# Patient Record
Sex: Female | Born: 1955
Health system: Southern US, Community
[De-identification: ages and names within clinical notes are randomized; demographics above are authoritative.]

## PROBLEM LIST (undated history)

## (undated) DIAGNOSIS — C73 Malignant neoplasm of thyroid gland: Secondary | ICD-10-CM

## (undated) HISTORY — PX: ABDOMINAL HYSTERECTOMY: SHX81

---

## 2006-04-20 HISTORY — PX: BREAST BIOPSY: SHX20

## 2010-04-20 DIAGNOSIS — C73 Malignant neoplasm of thyroid gland: Secondary | ICD-10-CM

## 2010-04-20 HISTORY — DX: Malignant neoplasm of thyroid gland: C73

## 2011-10-26 ENCOUNTER — Ambulatory Visit: Payer: Self-pay | Admitting: Family Medicine

## 2012-02-19 ENCOUNTER — Ambulatory Visit: Payer: Self-pay | Admitting: Gastroenterology

## 2012-10-26 ENCOUNTER — Ambulatory Visit: Payer: Self-pay | Admitting: Family Medicine

## 2013-11-09 ENCOUNTER — Ambulatory Visit: Payer: Self-pay | Admitting: Family Medicine

## 2014-10-17 ENCOUNTER — Other Ambulatory Visit: Payer: Self-pay | Admitting: Family Medicine

## 2014-10-17 DIAGNOSIS — Z1231 Encounter for screening mammogram for malignant neoplasm of breast: Secondary | ICD-10-CM

## 2014-11-12 ENCOUNTER — Ambulatory Visit: Payer: Self-pay

## 2014-11-19 ENCOUNTER — Ambulatory Visit
Admission: RE | Admit: 2014-11-19 | Discharge: 2014-11-19 | Disposition: A | Discharge status: Home / Self Care | Payer: BLUE CROSS/BLUE SHIELD | Source: Ambulatory Visit | Attending: Family Medicine | Admitting: Family Medicine

## 2014-11-19 DIAGNOSIS — Z1231 Encounter for screening mammogram for malignant neoplasm of breast: Secondary | ICD-10-CM | POA: Diagnosis not present

## 2014-11-19 HISTORY — DX: Malignant neoplasm of thyroid gland: C73

## 2015-10-17 DIAGNOSIS — Z7189 Other specified counseling: Secondary | ICD-10-CM | POA: Insufficient documentation

## 2015-10-17 DIAGNOSIS — Z7185 Encounter for immunization safety counseling: Secondary | ICD-10-CM | POA: Insufficient documentation

## 2015-11-18 ENCOUNTER — Other Ambulatory Visit: Payer: Self-pay | Admitting: Family Medicine

## 2015-11-18 DIAGNOSIS — Z1231 Encounter for screening mammogram for malignant neoplasm of breast: Secondary | ICD-10-CM

## 2015-12-02 ENCOUNTER — Ambulatory Visit
Admission: RE | Admit: 2015-12-02 | Discharge: 2015-12-02 | Disposition: A | Discharge status: Home / Self Care | Payer: BLUE CROSS/BLUE SHIELD | Source: Ambulatory Visit | Attending: Family Medicine | Admitting: Family Medicine

## 2015-12-02 ENCOUNTER — Other Ambulatory Visit: Payer: Self-pay | Admitting: Family Medicine

## 2015-12-02 DIAGNOSIS — Z1231 Encounter for screening mammogram for malignant neoplasm of breast: Secondary | ICD-10-CM | POA: Diagnosis present

## 2016-04-17 DIAGNOSIS — E890 Postprocedural hypothyroidism: Secondary | ICD-10-CM | POA: Insufficient documentation

## 2016-11-04 ENCOUNTER — Other Ambulatory Visit: Payer: Self-pay

## 2016-11-04 DIAGNOSIS — Z0000 Encounter for general adult medical examination without abnormal findings: Secondary | ICD-10-CM

## 2016-11-05 LAB — COMPREHENSIVE METABOLIC PANEL
ALT: 21 IU/L (ref 0–32)
AST: 21 IU/L (ref 0–40)
Albumin/Globulin Ratio: 1.9 (ref 1.2–2.2)
Albumin: 4.5 g/dL (ref 3.5–5.5)
Alkaline Phosphatase: 58 IU/L (ref 39–117)
BUN/Creatinine Ratio: 18 (ref 9–23)
BUN: 18 mg/dL (ref 6–24)
Bilirubin Total: 1 mg/dL (ref 0.0–1.2)
CALCIUM: 10 mg/dL (ref 8.7–10.2)
CO2: 25 mmol/L (ref 20–29)
CREATININE: 0.98 mg/dL (ref 0.57–1.00)
Chloride: 100 mmol/L (ref 96–106)
GFR calc Af Amer: 74 mL/min/{1.73_m2} (ref >59)
GFR, EST NON AFRICAN AMERICAN: 64 mL/min/{1.73_m2} (ref >59)
GLOBULIN, TOTAL: 2.4 g/dL (ref 1.5–4.5)
GLUCOSE: 90 mg/dL (ref 65–99)
Potassium: 4.9 mmol/L (ref 3.5–5.2)
SODIUM: 140 mmol/L (ref 134–144)
Total Protein: 6.9 g/dL (ref 6.0–8.5)

## 2016-11-05 LAB — CBC WITH DIFFERENTIAL/PLATELET
BASOS: 0 %
Basophils Absolute: 0 10*3/uL (ref 0.0–0.2)
EOS (ABSOLUTE): 0.1 10*3/uL (ref 0.0–0.4)
EOS: 3 %
HEMATOCRIT: 41.8 % (ref 34.0–46.6)
HEMOGLOBIN: 13.5 g/dL (ref 11.1–15.9)
IMMATURE GRANS (ABS): 0 10*3/uL (ref 0.0–0.1)
IMMATURE GRANULOCYTES: 0 %
LYMPHS: 30 %
Lymphocytes Absolute: 1.3 10*3/uL (ref 0.7–3.1)
MCH: 26.7 pg (ref 26.6–33.0)
MCHC: 32.3 g/dL (ref 31.5–35.7)
MCV: 83 fL (ref 79–97)
MONOCYTES: 7 %
MONOS ABS: 0.3 10*3/uL (ref 0.1–0.9)
Neutrophils Absolute: 2.7 10*3/uL (ref 1.4–7.0)
Neutrophils: 60 %
Platelets: 385 10*3/uL — ABNORMAL HIGH (ref 150–379)
RBC: 5.06 x10E6/uL (ref 3.77–5.28)
RDW: 14.6 % (ref 12.3–15.4)
WBC: 4.5 10*3/uL (ref 3.4–10.8)

## 2016-11-05 LAB — HGB A1C W/O EAG: Hgb A1c MFr Bld: 5.6 % (ref 4.8–5.6)

## 2016-11-12 LAB — LIPID PANEL W/O CHOL/HDL RATIO
CHOLESTEROL TOTAL: 211 mg/dL — AB (ref 100–199)
HDL: 62 mg/dL (ref >39)
LDL CALC: 127 mg/dL — AB (ref 0–99)
Triglycerides: 112 mg/dL (ref 0–149)
VLDL Cholesterol Cal: 22 mg/dL (ref 5–40)

## 2016-11-12 LAB — SPECIMEN STATUS REPORT

## 2016-11-24 DIAGNOSIS — G4733 Obstructive sleep apnea (adult) (pediatric): Secondary | ICD-10-CM | POA: Diagnosis not present

## 2016-12-25 DIAGNOSIS — G4733 Obstructive sleep apnea (adult) (pediatric): Secondary | ICD-10-CM | POA: Diagnosis not present

## 2017-01-29 ENCOUNTER — Other Ambulatory Visit: Payer: Self-pay | Admitting: Family Medicine

## 2017-01-29 DIAGNOSIS — Z1231 Encounter for screening mammogram for malignant neoplasm of breast: Secondary | ICD-10-CM

## 2017-02-17 DIAGNOSIS — E890 Postprocedural hypothyroidism: Secondary | ICD-10-CM | POA: Diagnosis not present

## 2017-02-17 DIAGNOSIS — Z85850 Personal history of malignant neoplasm of thyroid: Secondary | ICD-10-CM | POA: Diagnosis not present

## 2017-02-24 ENCOUNTER — Ambulatory Visit
Admission: RE | Admit: 2017-02-24 | Discharge: 2017-02-24 | Disposition: A | Discharge status: Home / Self Care | Payer: BLUE CROSS/BLUE SHIELD | Source: Ambulatory Visit | Attending: Family Medicine | Admitting: Family Medicine

## 2017-02-24 DIAGNOSIS — Z1231 Encounter for screening mammogram for malignant neoplasm of breast: Secondary | ICD-10-CM | POA: Insufficient documentation

## 2017-02-24 DIAGNOSIS — D485 Neoplasm of uncertain behavior of skin: Secondary | ICD-10-CM | POA: Diagnosis not present

## 2017-02-24 DIAGNOSIS — Z85850 Personal history of malignant neoplasm of thyroid: Secondary | ICD-10-CM | POA: Diagnosis not present

## 2017-02-24 DIAGNOSIS — L219 Seborrheic dermatitis, unspecified: Secondary | ICD-10-CM | POA: Diagnosis not present

## 2017-03-05 DIAGNOSIS — E890 Postprocedural hypothyroidism: Secondary | ICD-10-CM | POA: Diagnosis not present

## 2017-03-05 DIAGNOSIS — Z85850 Personal history of malignant neoplasm of thyroid: Secondary | ICD-10-CM | POA: Diagnosis not present

## 2017-04-22 DIAGNOSIS — D2210 Melanocytic nevi of unspecified eyelid, including canthus: Secondary | ICD-10-CM | POA: Diagnosis not present

## 2017-04-29 DIAGNOSIS — L219 Seborrheic dermatitis, unspecified: Secondary | ICD-10-CM | POA: Diagnosis not present

## 2017-04-29 DIAGNOSIS — D485 Neoplasm of uncertain behavior of skin: Secondary | ICD-10-CM | POA: Diagnosis not present

## 2017-08-19 DIAGNOSIS — G4733 Obstructive sleep apnea (adult) (pediatric): Secondary | ICD-10-CM | POA: Diagnosis not present

## 2017-11-04 DIAGNOSIS — D485 Neoplasm of uncertain behavior of skin: Secondary | ICD-10-CM | POA: Diagnosis not present

## 2017-11-04 DIAGNOSIS — Z1283 Encounter for screening for malignant neoplasm of skin: Secondary | ICD-10-CM | POA: Diagnosis not present

## 2017-11-04 DIAGNOSIS — D225 Melanocytic nevi of trunk: Secondary | ICD-10-CM | POA: Diagnosis not present

## 2017-11-04 DIAGNOSIS — L219 Seborrheic dermatitis, unspecified: Secondary | ICD-10-CM | POA: Diagnosis not present

## 2017-11-04 DIAGNOSIS — L820 Inflamed seborrheic keratosis: Secondary | ICD-10-CM | POA: Diagnosis not present

## 2017-11-19 DIAGNOSIS — Z0000 Encounter for general adult medical examination without abnormal findings: Secondary | ICD-10-CM | POA: Diagnosis not present

## 2017-11-19 DIAGNOSIS — Z131 Encounter for screening for diabetes mellitus: Secondary | ICD-10-CM | POA: Diagnosis not present

## 2017-11-26 DIAGNOSIS — I10 Essential (primary) hypertension: Secondary | ICD-10-CM | POA: Diagnosis not present

## 2017-11-26 DIAGNOSIS — R7303 Prediabetes: Secondary | ICD-10-CM | POA: Diagnosis not present

## 2017-11-26 DIAGNOSIS — Z0000 Encounter for general adult medical examination without abnormal findings: Secondary | ICD-10-CM | POA: Diagnosis not present

## 2017-11-26 DIAGNOSIS — E785 Hyperlipidemia, unspecified: Secondary | ICD-10-CM | POA: Diagnosis not present

## 2018-01-07 ENCOUNTER — Encounter: Payer: Self-pay | Admitting: Adult Health

## 2018-01-07 ENCOUNTER — Ambulatory Visit: Payer: Self-pay | Admitting: Adult Health

## 2018-01-07 VITALS — BP 140/75 | HR 72 | Temp 98.1°F | Resp 16 | Ht 65.0 in | Wt 159.0 lb

## 2018-01-07 DIAGNOSIS — G4733 Obstructive sleep apnea (adult) (pediatric): Secondary | ICD-10-CM | POA: Insufficient documentation

## 2018-01-07 DIAGNOSIS — Z9989 Dependence on other enabling machines and devices: Secondary | ICD-10-CM

## 2018-01-07 DIAGNOSIS — H66001 Acute suppurative otitis media without spontaneous rupture of ear drum, right ear: Secondary | ICD-10-CM

## 2018-01-07 DIAGNOSIS — I10 Essential (primary) hypertension: Secondary | ICD-10-CM | POA: Insufficient documentation

## 2018-01-07 MED ORDER — AMOXICILLIN 875 MG PO TABS: 875.0000 mg | ORAL_TABLET | Freq: Two times a day (BID) | ORAL | 0 refills | Status: AC

## 2018-01-07 NOTE — Progress Notes (Signed)
Subjective:     Patient ID: Sherri Howe, female   DOB: 08-13-1955, 62 y.o.   MRN: [id number]  HPI   Blood pressure 140/75, pulse 72, temperature 98.1 F (36.7 C), resp. rate 16, height 5\' 5"  (1.651 m), weight 159 lb (72.1 kg), SpO2 100 %.  No LMP recorded. Patient has had a hysterectomy.  Patient is a 62 year old female in no acute distress right 9/19right ear pain since yesterday.  Mild allergies- stuffy nose, mild scratchy dry throat. Not taking Claritin. Denies any other symptoms.   Patient  denies any fever, body aches,chills, rash, chest pain, shortness of breath, nausea, vomiting, or diarrhea.    Review of Systems  Constitutional: Negative.   HENT: Positive for ear pain and rhinorrhea. Negative for congestion, dental problem, drooling, ear discharge, facial swelling, hearing loss, mouth sores, nosebleeds, postnasal drip, sinus pressure, sinus pain, sneezing, sore throat, tinnitus, trouble swallowing and voice change.   Respiratory: Negative.   Cardiovascular: Negative.   Gastrointestinal: Negative.   Genitourinary: Negative.   Musculoskeletal: Negative.   Allergic/Immunologic: Negative.   Neurological: Negative.   Hematological: Negative.   Psychiatric/Behavioral: Negative.        Objective:   Physical Exam  Constitutional: Vital signs are normal. She appears well-developed and well-nourished. She is active.  Non-toxic appearance. No distress.  HENT:  Head: Normocephalic and atraumatic.  Right Ear: Hearing, external ear and ear canal normal. No tenderness. Tympanic membrane is not perforated and not erythematous. A middle ear effusion (fluid is sligtly yellow behind lower normal in apperance tympanic membrane ) is present.  Left Ear: Hearing and external ear normal. No tenderness. Tympanic membrane is not perforated and not erythematous. A middle ear effusion (clear fluid behind tympanic membrane ) is present.  Nose: Rhinorrhea present. No mucosal edema, nose  lacerations, sinus tenderness, nasal deformity, septal deviation or nasal septal hematoma. No epistaxis.  No foreign bodies. Right sinus exhibits no maxillary sinus tenderness and no frontal sinus tenderness. Left sinus exhibits no maxillary sinus tenderness and no frontal sinus tenderness.  Mouth/Throat: Uvula is midline, oropharynx is clear and moist and mucous membranes are normal. No oropharyngeal exudate. Tonsils are 0 on the right. Tonsils are 0 on the left. No tonsillar exudate.  Eyes: Pupils are equal, round, and reactive to light. Conjunctivae and EOM are normal. Right eye exhibits no discharge. Left eye exhibits no discharge. No scleral icterus.  Neck: Normal range of motion. Neck supple. No JVD present. No tracheal deviation present. No thyromegaly present.  Cardiovascular: Normal rate, regular rhythm, normal heart sounds and intact distal pulses. Exam reveals no gallop and no friction rub.  No murmur heard. Pulmonary/Chest: Effort normal and breath sounds normal. No stridor. No respiratory distress. She has no wheezes. She has no rales. She exhibits no tenderness.  Abdominal: Soft. Bowel sounds are normal.  Musculoskeletal: Normal range of motion.  Lymphadenopathy:    She has no cervical adenopathy.  Neurological: She is alert.  Skin: Skin is warm and dry. Capillary refill takes less than 2 seconds. She is not diaphoretic.  Psychiatric: She has a normal mood and affect.  Vitals reviewed.      Assessment:     Non-recurrent acute suppurative otitis media of right ear without spontaneous rupture of tympanic membrane      Plan:     Only antibiotics  take if ear pain worsens over the weekend, or if fever 100.5 or higher.  Advised start Claritin and Flonase daily per package instructions  and continue. Return to the clinic if no improvement within 1 week and ant any time if symptoms worsen.     Meds ordered this encounter  Medications  . amoxicillin (AMOXIL) 875 MG tablet     Sig: Take 1 tablet (875 mg total) by mouth 2 (two) times daily.    Dispense:  20 tablet    Refill:  0     Advised patient call the office or your primary care doctor for an appointment if no improvement within 72 hours or if any symptoms change or worsen at any time  Advised ER or urgent Care if after hours or on weekend. Call 911 for emergency symptoms at any time.Patinet verbalized understanding of all instructions given/reviewed and treatment plan and has no further questions or concerns at this time.    Patient verbalized understanding of all instructions given and denies any further questions at this time.

## 2018-01-07 NOTE — Patient Instructions (Signed)
Otitis Media, Adult Otitis media is redness, soreness, and puffiness (swelling) in the space just behind your eardrum (middle ear). It may be caused by allergies or infection. It often happens along with a cold. Follow these instructions at home:  Take your medicine as told. Finish it even if you start to feel better.  Only take over-the-counter or prescription medicines for pain, discomfort, or fever as told by your doctor.  Follow up with your doctor as told. Contact a doctor if:  You have otitis media only in one ear, or bleeding from your nose, or both.  You notice a lump on your neck.  You are not getting better in 3-5 days.  You feel worse instead of better. Get help right away if:  You have pain that is not helped with medicine.  You have puffiness, redness, or pain around your ear.  You get a stiff neck.  You cannot move part of your face (paralysis).  You notice that the bone behind your ear hurts when you touch it. This information is not intended to replace advice given to you by your health care provider. Make sure you discuss any questions you have with your health care provider. Document Released: 09/23/2007 Document Revised: 09/12/2015 Document Reviewed: 11/01/2012 [employer] Interactive Patient Education  2017 [employer]  

## 2018-02-25 DIAGNOSIS — E890 Postprocedural hypothyroidism: Secondary | ICD-10-CM | POA: Diagnosis not present

## 2018-02-28 ENCOUNTER — Other Ambulatory Visit: Payer: Self-pay | Admitting: Family Medicine

## 2018-02-28 DIAGNOSIS — Z1231 Encounter for screening mammogram for malignant neoplasm of breast: Secondary | ICD-10-CM

## 2018-03-04 DIAGNOSIS — E890 Postprocedural hypothyroidism: Secondary | ICD-10-CM | POA: Diagnosis not present

## 2018-03-25 ENCOUNTER — Ambulatory Visit
Admission: RE | Admit: 2018-03-25 | Discharge: 2018-03-25 | Disposition: A | Discharge status: Home / Self Care | Payer: BLUE CROSS/BLUE SHIELD | Source: Ambulatory Visit | Attending: Family Medicine | Admitting: Family Medicine

## 2018-03-25 DIAGNOSIS — Z1231 Encounter for screening mammogram for malignant neoplasm of breast: Secondary | ICD-10-CM | POA: Insufficient documentation

## 2018-05-03 DIAGNOSIS — E785 Hyperlipidemia, unspecified: Secondary | ICD-10-CM | POA: Diagnosis not present

## 2018-05-03 DIAGNOSIS — R7303 Prediabetes: Secondary | ICD-10-CM | POA: Diagnosis not present

## 2018-05-03 DIAGNOSIS — I10 Essential (primary) hypertension: Secondary | ICD-10-CM | POA: Diagnosis not present

## 2018-05-10 DIAGNOSIS — I10 Essential (primary) hypertension: Secondary | ICD-10-CM | POA: Diagnosis not present

## 2018-05-10 DIAGNOSIS — R7303 Prediabetes: Secondary | ICD-10-CM | POA: Diagnosis not present

## 2018-05-10 DIAGNOSIS — G4733 Obstructive sleep apnea (adult) (pediatric): Secondary | ICD-10-CM | POA: Diagnosis not present

## 2018-05-10 DIAGNOSIS — E785 Hyperlipidemia, unspecified: Secondary | ICD-10-CM | POA: Diagnosis not present

## 2018-11-01 DIAGNOSIS — E039 Hypothyroidism, unspecified: Secondary | ICD-10-CM | POA: Diagnosis not present

## 2018-11-01 DIAGNOSIS — E785 Hyperlipidemia, unspecified: Secondary | ICD-10-CM | POA: Diagnosis not present

## 2018-11-01 DIAGNOSIS — Z0000 Encounter for general adult medical examination without abnormal findings: Secondary | ICD-10-CM | POA: Diagnosis not present

## 2018-11-01 DIAGNOSIS — E559 Vitamin D deficiency, unspecified: Secondary | ICD-10-CM | POA: Diagnosis not present

## 2018-11-01 DIAGNOSIS — I10 Essential (primary) hypertension: Secondary | ICD-10-CM | POA: Diagnosis not present

## 2018-12-01 DIAGNOSIS — H40023 Open angle with borderline findings, high risk, bilateral: Secondary | ICD-10-CM | POA: Diagnosis not present

## 2018-12-01 DIAGNOSIS — Z83511 Family history of glaucoma: Secondary | ICD-10-CM | POA: Diagnosis not present

## 2019-05-30 DIAGNOSIS — R410 Disorientation, unspecified: Secondary | ICD-10-CM

## 2019-05-30 DIAGNOSIS — I160 Hypertensive urgency: Secondary | ICD-10-CM

## 2019-05-30 DIAGNOSIS — E785 Hyperlipidemia, unspecified: Secondary | ICD-10-CM

## 2019-05-31 DIAGNOSIS — R410 Disorientation, unspecified: Secondary | ICD-10-CM | POA: Diagnosis not present

## 2019-05-31 DIAGNOSIS — E785 Hyperlipidemia, unspecified: Secondary | ICD-10-CM | POA: Diagnosis not present

## 2019-05-31 DIAGNOSIS — I160 Hypertensive urgency: Secondary | ICD-10-CM | POA: Diagnosis not present

## 2020-05-26 IMAGING — MG DIGITAL SCREENING BILATERAL MAMMOGRAM WITH TOMO AND CAD
8 series · 9 of 24 positions shown · non-contrast
Comparison: Previous exam(s).

CLINICAL DATA: Screening.

EXAM:
DIGITAL SCREENING BILATERAL MAMMOGRAM WITH TOMO AND CAD

[R MLO synth-2D]
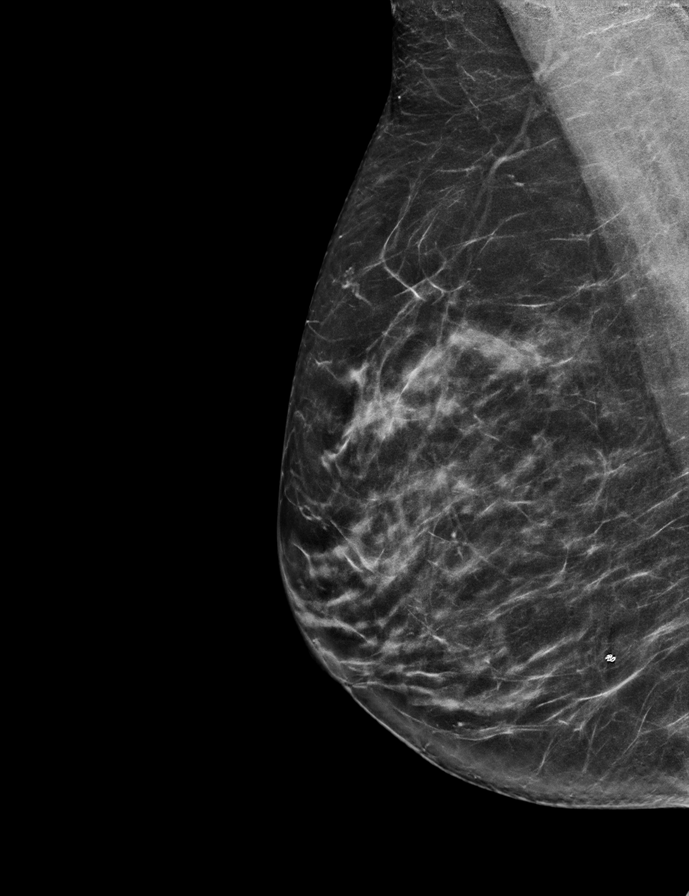

[L MLO synth-2D]
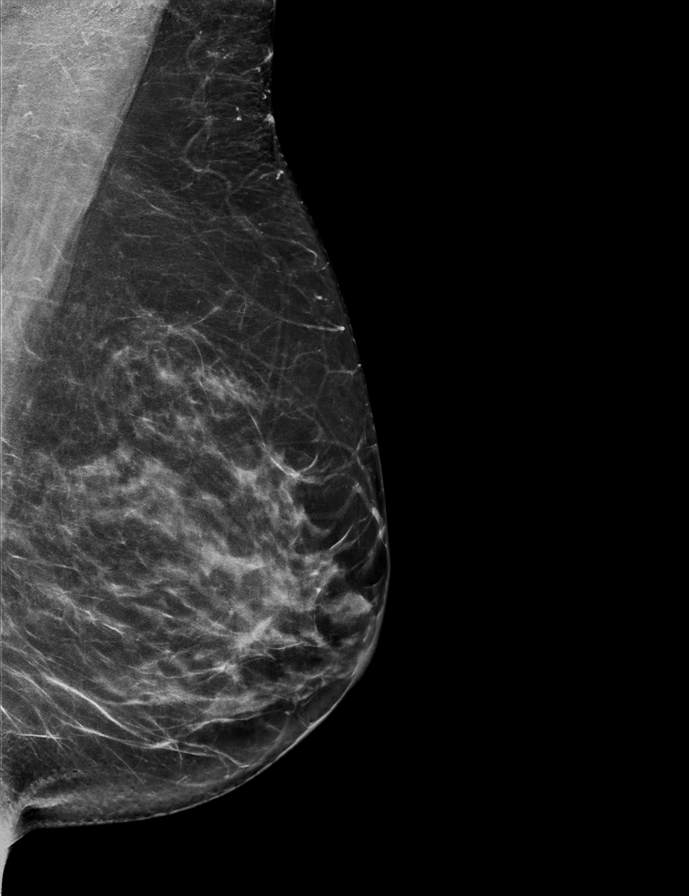

[R CC synth-2D]
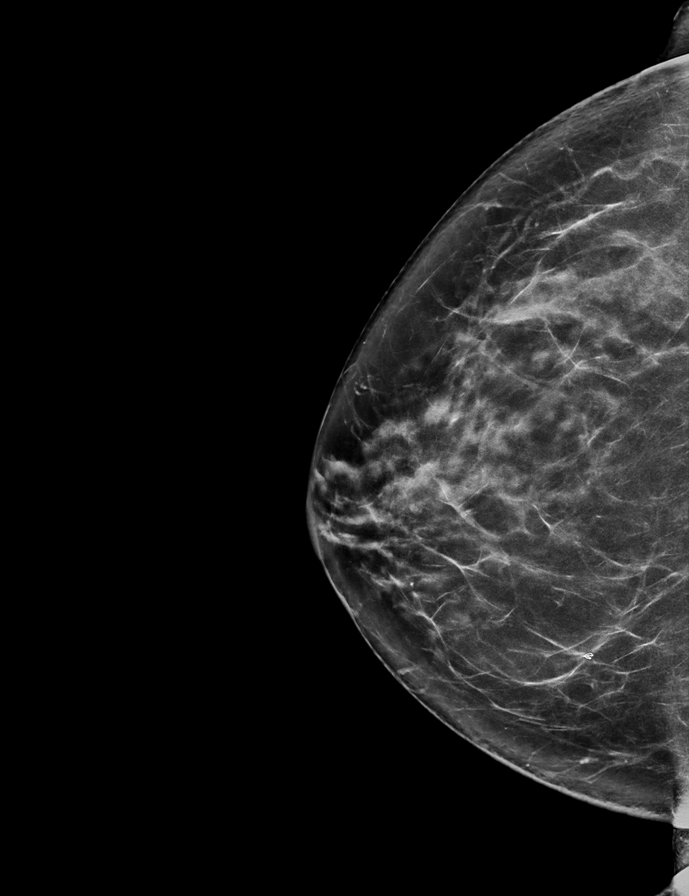

[L CC synth-2D]
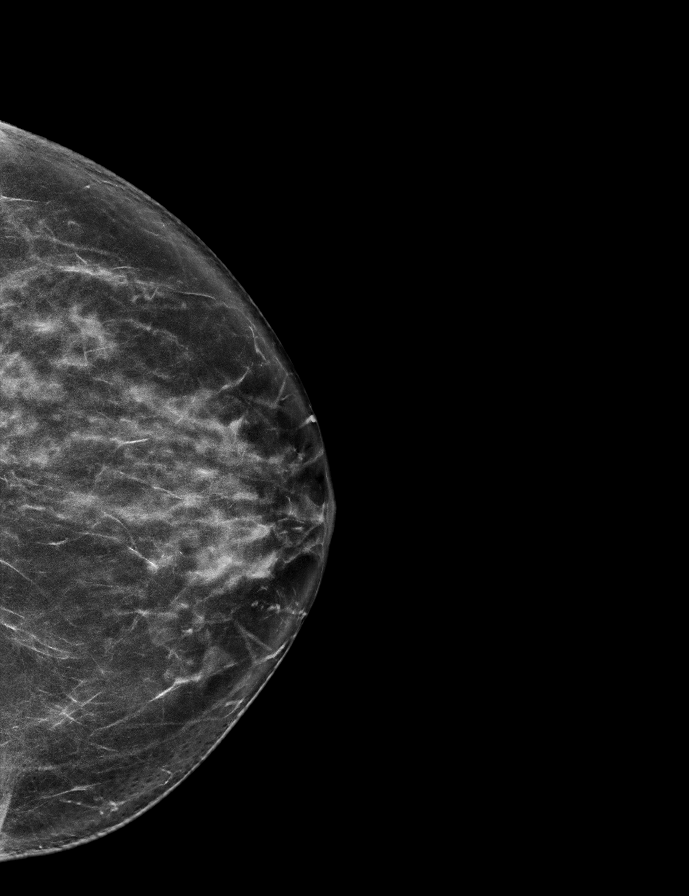

[L CC tomo · 2 of 71 frames shown]
[frame 23/71]
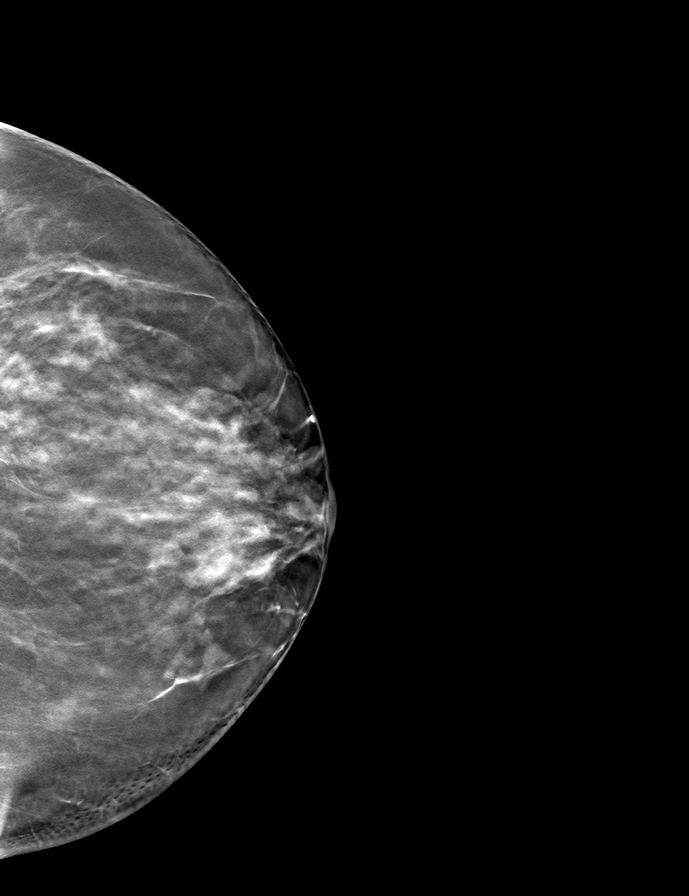
[frame 36/71]
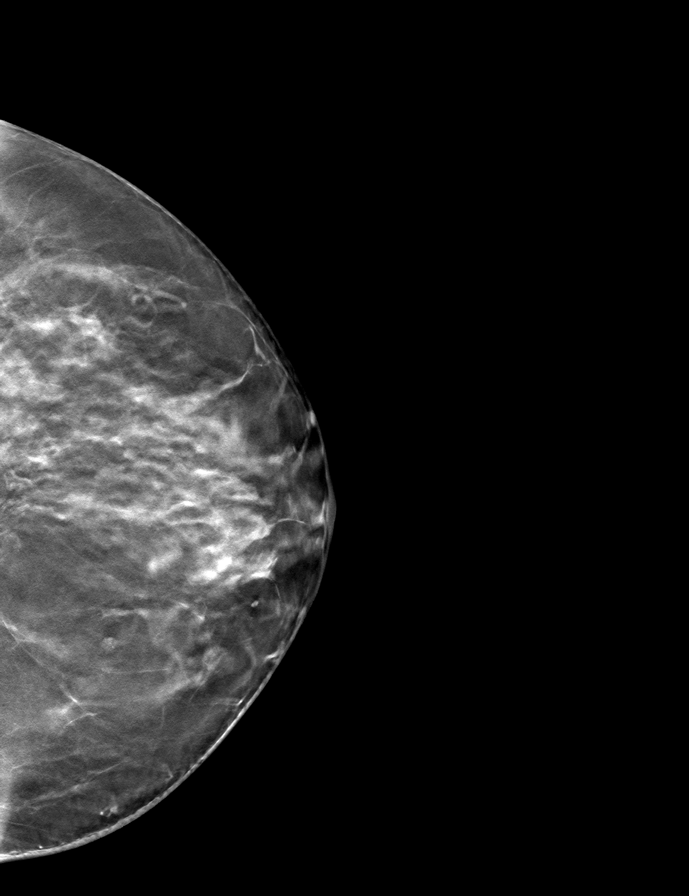

[R CC tomo · tomo slice 37/74.0]
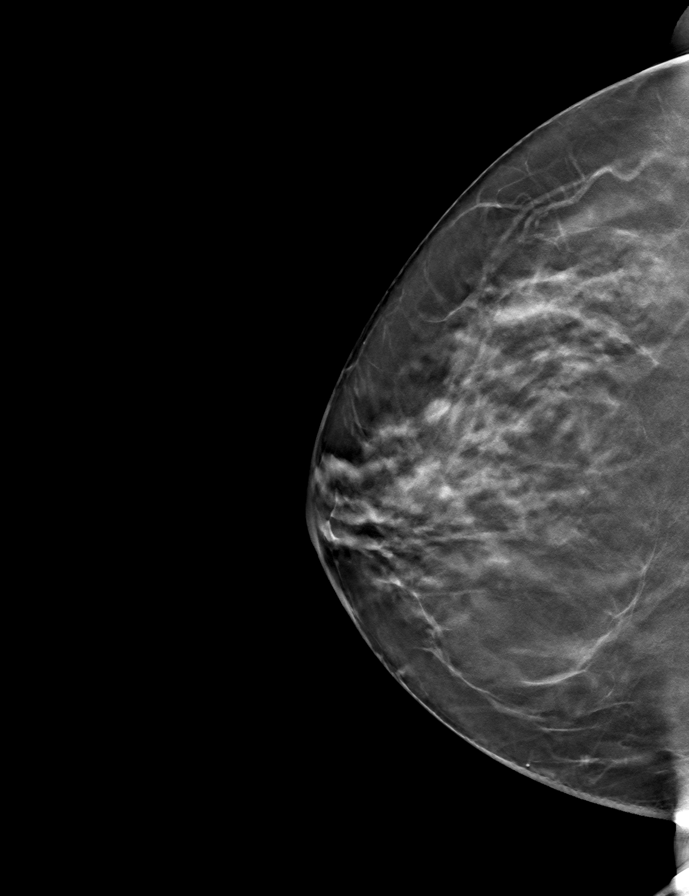

[L MLO tomo · tomo slice 37/72.0]
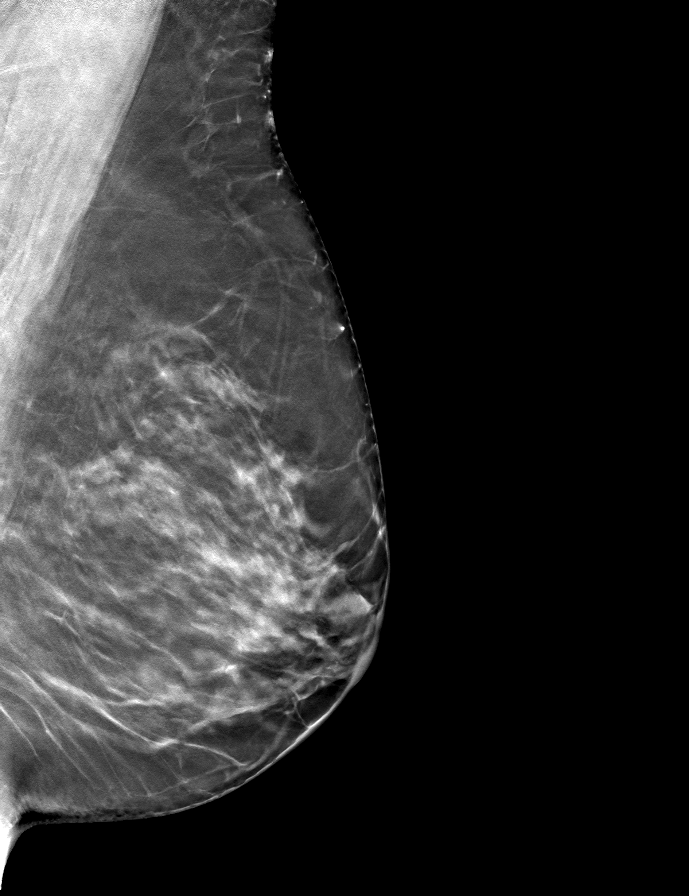

[R MLO tomo · tomo slice 35/68.0]
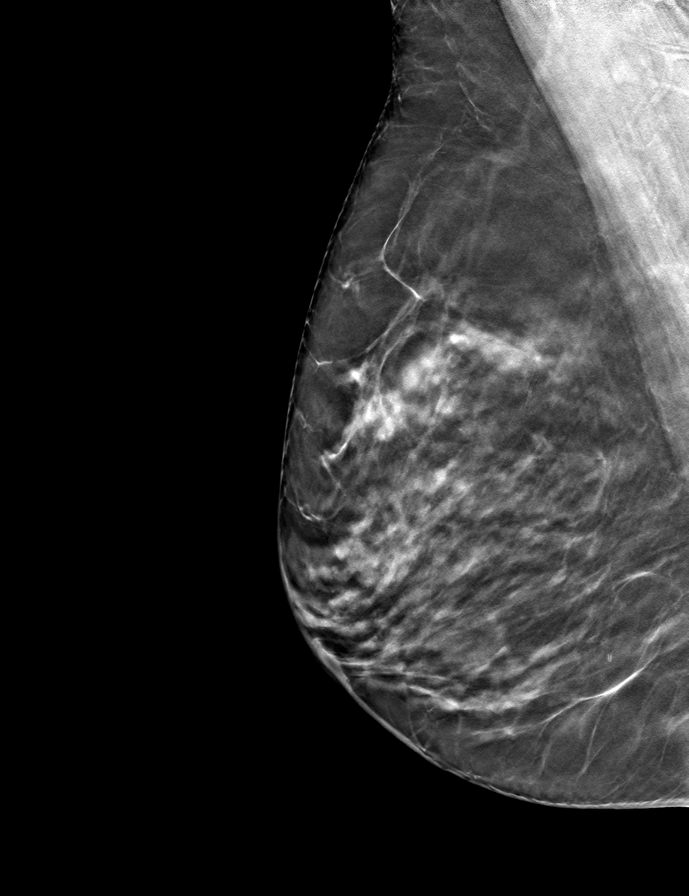

[9 of 24 positions shown; findings below may reference images not displayed]

ACR Breast Density Category b: There are scattered areas of
fibroglandular density.
FINDINGS: There are no findings suspicious for malignancy. Images were
processed with CAD.
IMPRESSION: No mammographic evidence of malignancy. A result letter of this
screening mammogram will be mailed directly to the patient.

RECOMMENDATION:
Screening mammogram in one year. (Code:[id number])

BI-RADS CATEGORY  1: Negative.

## 2021-05-03 DIAGNOSIS — E785 Hyperlipidemia, unspecified: Secondary | ICD-10-CM | POA: Diagnosis not present

## 2021-05-03 DIAGNOSIS — R7989 Other specified abnormal findings of blood chemistry: Secondary | ICD-10-CM | POA: Diagnosis not present

## 2021-05-03 DIAGNOSIS — E039 Hypothyroidism, unspecified: Secondary | ICD-10-CM | POA: Diagnosis not present

## 2021-05-03 DIAGNOSIS — I10 Essential (primary) hypertension: Secondary | ICD-10-CM | POA: Diagnosis not present

## 2021-08-02 DIAGNOSIS — Z6827 Body mass index (BMI) 27.0-27.9, adult: Secondary | ICD-10-CM | POA: Diagnosis not present

## 2021-08-02 DIAGNOSIS — Z0000 Encounter for general adult medical examination without abnormal findings: Secondary | ICD-10-CM | POA: Diagnosis not present

## 2021-08-02 DIAGNOSIS — E039 Hypothyroidism, unspecified: Secondary | ICD-10-CM | POA: Diagnosis not present

## 2021-08-02 DIAGNOSIS — E785 Hyperlipidemia, unspecified: Secondary | ICD-10-CM | POA: Diagnosis not present

## 2021-08-02 DIAGNOSIS — I10 Essential (primary) hypertension: Secondary | ICD-10-CM | POA: Diagnosis not present

## 2021-10-01 DIAGNOSIS — Z79899 Other long term (current) drug therapy: Secondary | ICD-10-CM | POA: Diagnosis not present

## 2021-10-01 DIAGNOSIS — G40209 Localization-related (focal) (partial) symptomatic epilepsy and epileptic syndromes with complex partial seizures, not intractable, without status epilepticus: Secondary | ICD-10-CM | POA: Diagnosis not present

## 2021-11-24 DIAGNOSIS — I10 Essential (primary) hypertension: Secondary | ICD-10-CM | POA: Diagnosis not present

## 2021-11-24 DIAGNOSIS — E039 Hypothyroidism, unspecified: Secondary | ICD-10-CM | POA: Diagnosis not present

## 2021-11-24 DIAGNOSIS — Z79899 Other long term (current) drug therapy: Secondary | ICD-10-CM | POA: Diagnosis not present

## 2021-11-24 DIAGNOSIS — Z23 Encounter for immunization: Secondary | ICD-10-CM | POA: Diagnosis not present

## 2021-11-24 DIAGNOSIS — E785 Hyperlipidemia, unspecified: Secondary | ICD-10-CM | POA: Diagnosis not present

## 2021-11-24 DIAGNOSIS — E559 Vitamin D deficiency, unspecified: Secondary | ICD-10-CM | POA: Diagnosis not present

## 2021-12-01 DIAGNOSIS — Z1231 Encounter for screening mammogram for malignant neoplasm of breast: Secondary | ICD-10-CM | POA: Diagnosis not present

## 2021-12-02 DIAGNOSIS — H40023 Open angle with borderline findings, high risk, bilateral: Secondary | ICD-10-CM | POA: Diagnosis not present

## 2021-12-02 DIAGNOSIS — Z83511 Family history of glaucoma: Secondary | ICD-10-CM | POA: Diagnosis not present

## 2021-12-24 DIAGNOSIS — R928 Other abnormal and inconclusive findings on diagnostic imaging of breast: Secondary | ICD-10-CM | POA: Diagnosis not present

## 2021-12-24 DIAGNOSIS — R922 Inconclusive mammogram: Secondary | ICD-10-CM | POA: Diagnosis not present

## 2022-02-19 DIAGNOSIS — Z01818 Encounter for other preprocedural examination: Secondary | ICD-10-CM | POA: Diagnosis not present

## 2022-03-24 DIAGNOSIS — Z1211 Encounter for screening for malignant neoplasm of colon: Secondary | ICD-10-CM | POA: Diagnosis not present

## 2022-03-24 DIAGNOSIS — Z7982 Long term (current) use of aspirin: Secondary | ICD-10-CM | POA: Diagnosis not present

## 2022-06-02 DIAGNOSIS — E785 Hyperlipidemia, unspecified: Secondary | ICD-10-CM | POA: Diagnosis not present

## 2022-06-02 DIAGNOSIS — I10 Essential (primary) hypertension: Secondary | ICD-10-CM | POA: Diagnosis not present

## 2022-06-02 DIAGNOSIS — Z79899 Other long term (current) drug therapy: Secondary | ICD-10-CM | POA: Diagnosis not present

## 2022-06-02 DIAGNOSIS — E559 Vitamin D deficiency, unspecified: Secondary | ICD-10-CM | POA: Diagnosis not present

## 2022-06-02 DIAGNOSIS — E039 Hypothyroidism, unspecified: Secondary | ICD-10-CM | POA: Diagnosis not present

## 2022-06-02 DIAGNOSIS — R7303 Prediabetes: Secondary | ICD-10-CM | POA: Diagnosis not present

## 2022-10-02 DIAGNOSIS — G40209 Localization-related (focal) (partial) symptomatic epilepsy and epileptic syndromes with complex partial seizures, not intractable, without status epilepticus: Secondary | ICD-10-CM | POA: Diagnosis not present

## 2022-12-01 ENCOUNTER — Other Ambulatory Visit: Payer: Self-pay | Admitting: Family Medicine

## 2022-12-01 DIAGNOSIS — I10 Essential (primary) hypertension: Secondary | ICD-10-CM | POA: Diagnosis not present

## 2022-12-01 DIAGNOSIS — Z83511 Family history of glaucoma: Secondary | ICD-10-CM | POA: Diagnosis not present

## 2022-12-01 DIAGNOSIS — R7303 Prediabetes: Secondary | ICD-10-CM | POA: Diagnosis not present

## 2022-12-01 DIAGNOSIS — Z1331 Encounter for screening for depression: Secondary | ICD-10-CM | POA: Diagnosis not present

## 2022-12-01 DIAGNOSIS — Z79899 Other long term (current) drug therapy: Secondary | ICD-10-CM | POA: Diagnosis not present

## 2022-12-01 DIAGNOSIS — E039 Hypothyroidism, unspecified: Secondary | ICD-10-CM | POA: Diagnosis not present

## 2022-12-01 DIAGNOSIS — E785 Hyperlipidemia, unspecified: Secondary | ICD-10-CM | POA: Diagnosis not present

## 2022-12-01 DIAGNOSIS — E559 Vitamin D deficiency, unspecified: Secondary | ICD-10-CM | POA: Diagnosis not present

## 2022-12-01 DIAGNOSIS — H40023 Open angle with borderline findings, high risk, bilateral: Secondary | ICD-10-CM | POA: Diagnosis not present

## 2022-12-01 DIAGNOSIS — Z1231 Encounter for screening mammogram for malignant neoplasm of breast: Secondary | ICD-10-CM

## 2022-12-04 ENCOUNTER — Other Ambulatory Visit: Payer: Self-pay | Admitting: *Deleted

## 2022-12-04 ENCOUNTER — Inpatient Hospital Stay
Admission: RE | Admit: 2022-12-04 | Discharge: 2022-12-04 | Disposition: A | Discharge status: Home / Self Care | Payer: Self-pay | Source: Ambulatory Visit | Attending: Family Medicine | Admitting: Family Medicine

## 2022-12-04 DIAGNOSIS — Z1231 Encounter for screening mammogram for malignant neoplasm of breast: Secondary | ICD-10-CM

## 2023-03-01 ENCOUNTER — Ambulatory Visit
Admission: RE | Admit: 2023-03-01 | Discharge: 2023-03-01 | Disposition: A | Discharge status: Home / Self Care | Payer: Medicare Other | Source: Ambulatory Visit | Attending: Family Medicine | Admitting: Family Medicine

## 2023-03-01 DIAGNOSIS — Z1231 Encounter for screening mammogram for malignant neoplasm of breast: Secondary | ICD-10-CM | POA: Insufficient documentation

## 2023-03-04 ENCOUNTER — Inpatient Hospital Stay
Admission: RE | Admit: 2023-03-04 | Discharge: 2023-03-04 | Disposition: A | Discharge status: Home / Self Care | Payer: Self-pay | Source: Ambulatory Visit | Attending: Family Medicine | Admitting: Family Medicine

## 2023-03-04 ENCOUNTER — Other Ambulatory Visit: Payer: Self-pay | Admitting: *Deleted

## 2023-03-04 DIAGNOSIS — Z1231 Encounter for screening mammogram for malignant neoplasm of breast: Secondary | ICD-10-CM

## 2023-06-01 DIAGNOSIS — H40023 Open angle with borderline findings, high risk, bilateral: Secondary | ICD-10-CM | POA: Diagnosis not present

## 2023-06-01 DIAGNOSIS — H524 Presbyopia: Secondary | ICD-10-CM | POA: Diagnosis not present

## 2023-06-01 DIAGNOSIS — H5213 Myopia, bilateral: Secondary | ICD-10-CM | POA: Diagnosis not present

## 2023-06-01 DIAGNOSIS — Z83511 Family history of glaucoma: Secondary | ICD-10-CM | POA: Diagnosis not present

## 2023-06-09 DIAGNOSIS — E039 Hypothyroidism, unspecified: Secondary | ICD-10-CM | POA: Diagnosis not present

## 2023-06-09 DIAGNOSIS — E785 Hyperlipidemia, unspecified: Secondary | ICD-10-CM | POA: Diagnosis not present

## 2023-06-09 DIAGNOSIS — I10 Essential (primary) hypertension: Secondary | ICD-10-CM | POA: Diagnosis not present

## 2023-06-09 DIAGNOSIS — R7303 Prediabetes: Secondary | ICD-10-CM | POA: Diagnosis not present

## 2023-06-09 DIAGNOSIS — E559 Vitamin D deficiency, unspecified: Secondary | ICD-10-CM | POA: Diagnosis not present

## 2023-06-14 DIAGNOSIS — Z23 Encounter for immunization: Secondary | ICD-10-CM | POA: Diagnosis not present

## 2023-08-24 DIAGNOSIS — G40909 Epilepsy, unspecified, not intractable, without status epilepticus: Secondary | ICD-10-CM | POA: Diagnosis not present

## 2023-10-12 DIAGNOSIS — D225 Melanocytic nevi of trunk: Secondary | ICD-10-CM | POA: Diagnosis not present

## 2023-10-12 DIAGNOSIS — D2262 Melanocytic nevi of left upper limb, including shoulder: Secondary | ICD-10-CM | POA: Diagnosis not present

## 2023-10-12 DIAGNOSIS — D2272 Melanocytic nevi of left lower limb, including hip: Secondary | ICD-10-CM | POA: Diagnosis not present

## 2023-10-12 DIAGNOSIS — D2261 Melanocytic nevi of right upper limb, including shoulder: Secondary | ICD-10-CM | POA: Diagnosis not present

## 2023-10-15 DIAGNOSIS — G40909 Epilepsy, unspecified, not intractable, without status epilepticus: Secondary | ICD-10-CM | POA: Diagnosis not present

## 2023-11-29 DIAGNOSIS — H40023 Open angle with borderline findings, high risk, bilateral: Secondary | ICD-10-CM | POA: Diagnosis not present

## 2023-12-07 DIAGNOSIS — Z1331 Encounter for screening for depression: Secondary | ICD-10-CM | POA: Diagnosis not present

## 2023-12-07 DIAGNOSIS — E039 Hypothyroidism, unspecified: Secondary | ICD-10-CM | POA: Diagnosis not present

## 2023-12-07 DIAGNOSIS — E559 Vitamin D deficiency, unspecified: Secondary | ICD-10-CM | POA: Diagnosis not present

## 2023-12-07 DIAGNOSIS — E785 Hyperlipidemia, unspecified: Secondary | ICD-10-CM | POA: Diagnosis not present

## 2023-12-07 DIAGNOSIS — I10 Essential (primary) hypertension: Secondary | ICD-10-CM | POA: Diagnosis not present

## 2023-12-07 DIAGNOSIS — R7303 Prediabetes: Secondary | ICD-10-CM | POA: Diagnosis not present

## 2023-12-27 DIAGNOSIS — G40909 Epilepsy, unspecified, not intractable, without status epilepticus: Secondary | ICD-10-CM | POA: Diagnosis not present

## 2024-01-19 DIAGNOSIS — Z1231 Encounter for screening mammogram for malignant neoplasm of breast: Secondary | ICD-10-CM | POA: Diagnosis not present

## 2024-01-19 DIAGNOSIS — E2839 Other primary ovarian failure: Secondary | ICD-10-CM | POA: Diagnosis not present

## 2024-01-19 DIAGNOSIS — Z0000 Encounter for general adult medical examination without abnormal findings: Secondary | ICD-10-CM | POA: Diagnosis not present

## 2024-01-20 ENCOUNTER — Other Ambulatory Visit: Payer: Self-pay | Admitting: Family Medicine

## 2024-01-20 DIAGNOSIS — Z1231 Encounter for screening mammogram for malignant neoplasm of breast: Secondary | ICD-10-CM

## 2024-01-20 DIAGNOSIS — E2839 Other primary ovarian failure: Secondary | ICD-10-CM

## 2024-03-01 ENCOUNTER — Ambulatory Visit
Admission: RE | Admit: 2024-03-01 | Discharge: 2024-03-01 | Disposition: A | Discharge status: Home / Self Care | Source: Ambulatory Visit | Attending: Family Medicine | Admitting: Family Medicine

## 2024-03-01 DIAGNOSIS — E2839 Other primary ovarian failure: Secondary | ICD-10-CM | POA: Insufficient documentation

## 2024-03-01 DIAGNOSIS — Z1231 Encounter for screening mammogram for malignant neoplasm of breast: Secondary | ICD-10-CM | POA: Diagnosis not present

## 2024-03-01 DIAGNOSIS — M85852 Other specified disorders of bone density and structure, left thigh: Secondary | ICD-10-CM | POA: Diagnosis not present

## 2024-03-01 DIAGNOSIS — M85851 Other specified disorders of bone density and structure, right thigh: Secondary | ICD-10-CM | POA: Diagnosis not present

## 2024-03-01 DIAGNOSIS — Z780 Asymptomatic menopausal state: Secondary | ICD-10-CM | POA: Diagnosis not present
